# Patient Record
Sex: Male | Born: 1985 | Race: Black or African American | Hispanic: No | Marital: Single | Smoking: Current every day smoker
Health system: Southern US, Community
[De-identification: ages and names within clinical notes are randomized; demographics above are authoritative.]

---
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
## 2004-12-06 ENCOUNTER — Ambulatory Visit: Payer: Self-pay | Admitting: Family Medicine

## 2005-03-24 ENCOUNTER — Emergency Department (HOSPITAL_COMMUNITY): Admission: EM | Admit: 2005-03-24 | Discharge: 2005-03-24 | Payer: Self-pay | Admitting: Emergency Medicine

## 2007-11-19 ENCOUNTER — Emergency Department (HOSPITAL_COMMUNITY): Admission: EM | Admit: 2007-11-19 | Discharge: 2007-11-19 | Payer: Self-pay | Admitting: Family Medicine

## 2007-12-17 ENCOUNTER — Emergency Department (HOSPITAL_COMMUNITY): Admission: EM | Admit: 2007-12-17 | Discharge: 2007-12-17 | Payer: Self-pay | Admitting: Family Medicine

## 2010-01-17 ENCOUNTER — Emergency Department (HOSPITAL_COMMUNITY)
Admission: EM | Admit: 2010-01-17 | Discharge: 2010-01-17 | Payer: Self-pay | Source: Home / Self Care | Admitting: Family Medicine

## 2010-05-12 ENCOUNTER — Emergency Department (HOSPITAL_COMMUNITY)
Admission: EM | Admit: 2010-05-12 | Discharge: 2010-05-13 | Disposition: A | Discharge status: Home / Self Care | Payer: No Typology Code available for payment source | Attending: Emergency Medicine | Admitting: Emergency Medicine

## 2010-05-12 ENCOUNTER — Emergency Department (HOSPITAL_COMMUNITY): Payer: No Typology Code available for payment source

## 2010-05-12 DIAGNOSIS — Y92410 Unspecified street and highway as the place of occurrence of the external cause: Secondary | ICD-10-CM | POA: Insufficient documentation

## 2010-05-12 DIAGNOSIS — S0990XA Unspecified injury of head, initial encounter: Secondary | ICD-10-CM | POA: Insufficient documentation

## 2010-05-12 DIAGNOSIS — S139XXA Sprain of joints and ligaments of unspecified parts of neck, initial encounter: Secondary | ICD-10-CM | POA: Insufficient documentation

## 2010-07-30 ENCOUNTER — Emergency Department (HOSPITAL_COMMUNITY)
Admission: EM | Admit: 2010-07-30 | Discharge: 2010-07-30 | Disposition: A | Discharge status: Home / Self Care | Payer: Self-pay | Attending: Emergency Medicine | Admitting: Emergency Medicine

## 2010-07-30 DIAGNOSIS — Z113 Encounter for screening for infections with a predominantly sexual mode of transmission: Secondary | ICD-10-CM | POA: Insufficient documentation

## 2010-07-31 LAB — GC/CHLAMYDIA PROBE AMP, URINE
Chlamydia, Swab/Urine, PCR: NEGATIVE
GC Probe Amp, Urine: NEGATIVE

## 2010-12-11 ENCOUNTER — Emergency Department (INDEPENDENT_AMBULATORY_CARE_PROVIDER_SITE_OTHER)
Admission: EM | Admit: 2010-12-11 | Discharge: 2010-12-11 | Disposition: A | Discharge status: Home / Self Care | Payer: Self-pay | Source: Home / Self Care | Attending: Family Medicine | Admitting: Family Medicine

## 2010-12-11 DIAGNOSIS — M7550 Bursitis of unspecified shoulder: Secondary | ICD-10-CM

## 2010-12-11 DIAGNOSIS — M75100 Unspecified rotator cuff tear or rupture of unspecified shoulder, not specified as traumatic: Secondary | ICD-10-CM

## 2010-12-11 MED ORDER — IBUPROFEN 800 MG PO TABS
800.0000 mg | ORAL_TABLET | Freq: Once | ORAL | Status: AC
  Administered 2010-12-11: 800 mg via ORAL

## 2010-12-11 MED ORDER — NAPROXEN 500 MG PO TABS: 500.0000 mg | ORAL_TABLET | Freq: Two times a day (BID) | ORAL | Status: AC

## 2010-12-11 MED ORDER — IBUPROFEN 800 MG PO TABS
ORAL_TABLET | ORAL | Status: AC
  Filled 2010-12-11: qty 1

## 2010-12-11 NOTE — ED Provider Notes (Signed)
History     CSN: [id number] Arrival date & time: 12/11/2010 11:33 AM   First MD Initiated Contact with Patient 12/11/10 1048      Chief Complaint  Patient presents with  . Shoulder Pain    Pt was raising lt arm yesterday to put on jacket and had severe pain, unable to raise it now, no prior injuries    (Consider location/radiation/quality/duration/timing/severity/associated sxs/prior treatment) HPI Comments: Pt was reaching arm upward to pull sleeve down when felt pain in L shoulder; denies trauma  Patient is a 25 y.o. male presenting with shoulder pain. The history is provided by the patient.  Shoulder Pain The current episode started yesterday. The problem occurs constantly. The problem has not changed since onset.Exacerbated by: activity, palpation. The symptoms are relieved by nothing. He has tried nothing for the symptoms.    History reviewed. No pertinent past medical history.  History reviewed. No pertinent past surgical history.  History reviewed. No pertinent family history.  History  Substance Use Topics  . Smoking status: Current Everyday Smoker -- 1.0 packs/day    Types: Cigarettes  . Smokeless tobacco: Not on file  . Alcohol Use: No      Review of Systems  Constitutional: Negative for fever and chills.  Musculoskeletal: Negative for joint swelling.       Shoulder pain  Neurological: Negative for weakness and numbness.    Allergies  Review of patient's allergies indicates no known allergies.  Home Medications   Current Outpatient Rx  Name Route Sig Dispense Refill  . NAPROXEN 500 MG PO TABS Oral Take 1 tablet (500 mg total) by mouth 2 (two) times daily with a meal. 14 tablet 0    BP 120/74  Pulse 63  Temp(Src) 98 F (36.7 C) (Oral)  Resp 16  SpO2 100%  Physical Exam  Constitutional: He appears well-developed and well-nourished. No distress.  HENT:  Head: Normocephalic and atraumatic.  Pulmonary/Chest: Effort normal.  Musculoskeletal:      Left shoulder: He exhibits tenderness. He exhibits normal range of motion, no swelling, no effusion, no crepitus, no deformity, normal pulse and normal strength.       Left elbow: He exhibits normal range of motion. no tenderness found.  Neurological: He has normal strength. No sensory deficit.       Sensory and motor nl in LUE    ED Course  Procedures (including critical care time)  Labs Reviewed - No data to display No results found.   1. Subacromial bursitis       MDM          Cathlyn Parsons, NP 12/11/10 1255

## 2010-12-11 NOTE — ED Provider Notes (Signed)
Medical screening examination/treatment/procedure(s) were performed by non-physician practitioner and as supervising physician I was immediately available for consultation/collaboration.   Jelena Malicoat DOUGLAS MD.    Ojas Coone Douglas Cyann Venti, MD 12/11/10 2004 

## 2012-05-15 ENCOUNTER — Encounter (HOSPITAL_COMMUNITY): Payer: Self-pay | Admitting: Emergency Medicine

## 2012-05-15 ENCOUNTER — Emergency Department (HOSPITAL_COMMUNITY): Payer: Self-pay

## 2012-05-15 ENCOUNTER — Emergency Department (HOSPITAL_COMMUNITY)
Admission: EM | Admit: 2012-05-15 | Discharge: 2012-05-15 | Disposition: A | Discharge status: Home / Self Care | Payer: Self-pay | Attending: Emergency Medicine | Admitting: Emergency Medicine

## 2012-05-15 DIAGNOSIS — Y9289 Other specified places as the place of occurrence of the external cause: Secondary | ICD-10-CM | POA: Insufficient documentation

## 2012-05-15 DIAGNOSIS — Y9389 Activity, other specified: Secondary | ICD-10-CM | POA: Insufficient documentation

## 2012-05-15 DIAGNOSIS — S82839A Other fracture of upper and lower end of unspecified fibula, initial encounter for closed fracture: Secondary | ICD-10-CM | POA: Insufficient documentation

## 2012-05-15 DIAGNOSIS — F17200 Nicotine dependence, unspecified, uncomplicated: Secondary | ICD-10-CM | POA: Insufficient documentation

## 2012-05-15 DIAGNOSIS — S82201A Unspecified fracture of shaft of right tibia, initial encounter for closed fracture: Secondary | ICD-10-CM

## 2012-05-15 DIAGNOSIS — X500XXA Overexertion from strenuous movement or load, initial encounter: Secondary | ICD-10-CM | POA: Insufficient documentation

## 2012-05-15 MED ORDER — NAPROXEN 500 MG PO TABS: 500.0000 mg | ORAL_TABLET | Freq: Two times a day (BID) | ORAL | Status: DC

## 2012-05-15 MED ORDER — HYDROCODONE-ACETAMINOPHEN 5-325 MG PO TABS: 1.0000 | ORAL_TABLET | ORAL | Status: DC | PRN
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
## 2012-05-18 NOTE — ED Provider Notes (Signed)
Medical screening examination/treatment/procedure(s) were performed by non-physician practitioner and as supervising physician I was immediately available for consultation/collaboration  Alexismarie Flaim R. Nohlan Burdin, MD 05/18/12 1632 

## 2014-03-11 IMAGING — CR DG TIBIA/FIBULA 2V*R*
4 series · 4 of 4 positions shown · non-contrast
Comparison: None.

CLINICAL DATA: Fall, leg pain

RIGHT TIBIA AND FIBULA - 2 VIEW

[x tib-fib ap right (1 of 2)]
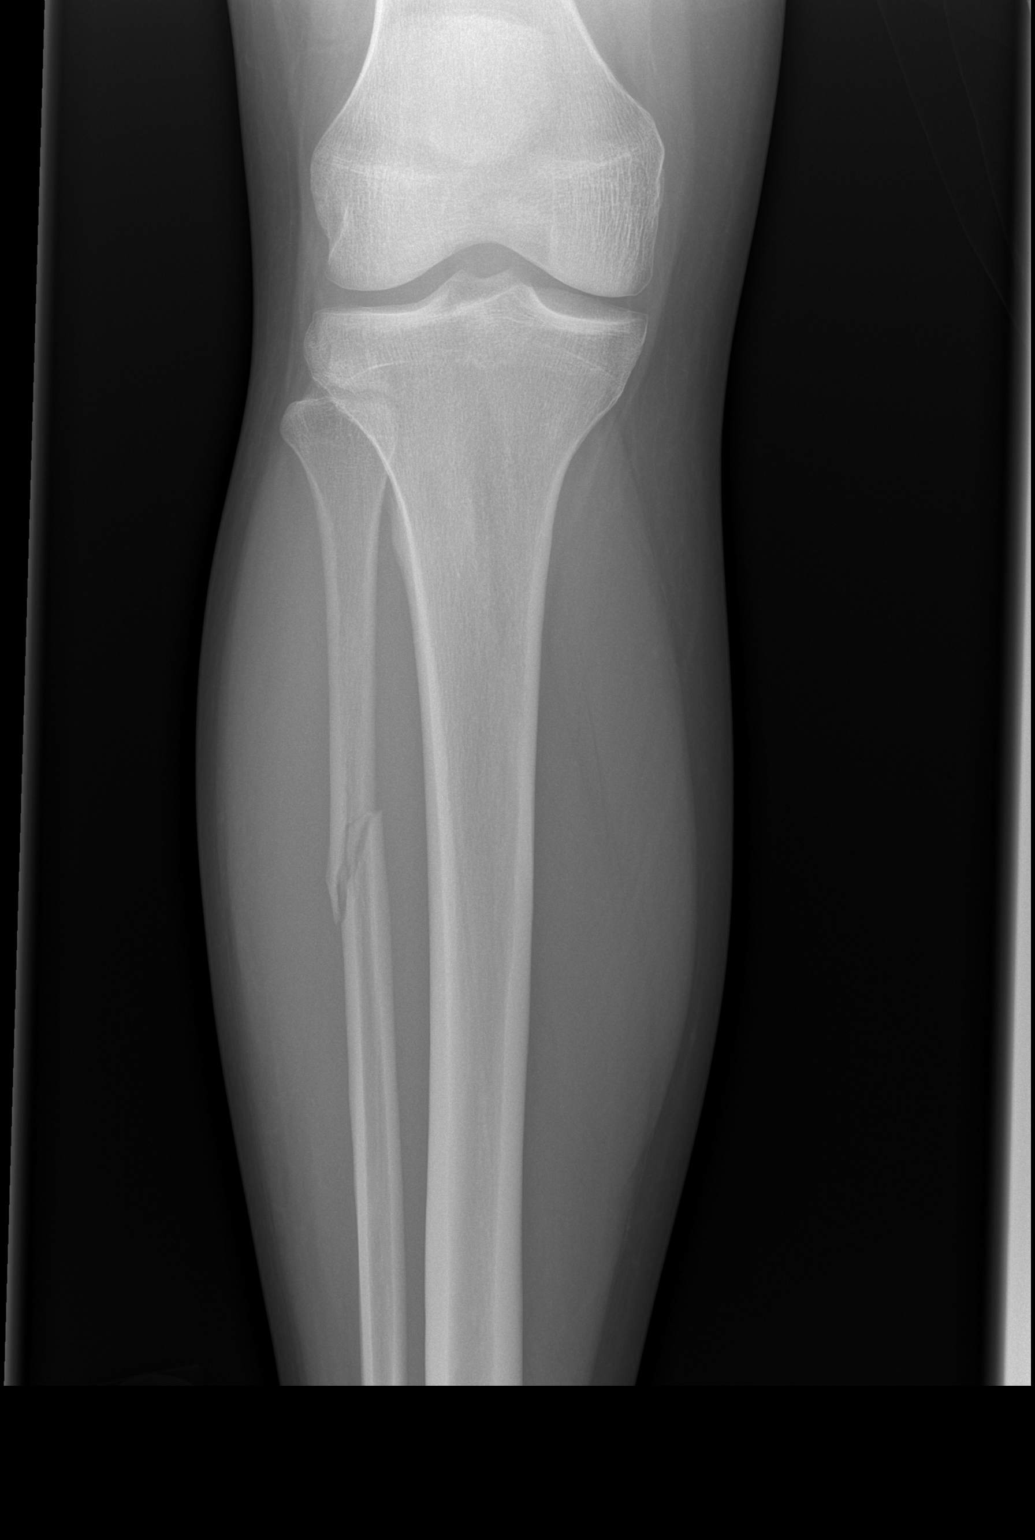

[x tib-fib ap right (2 of 2)]
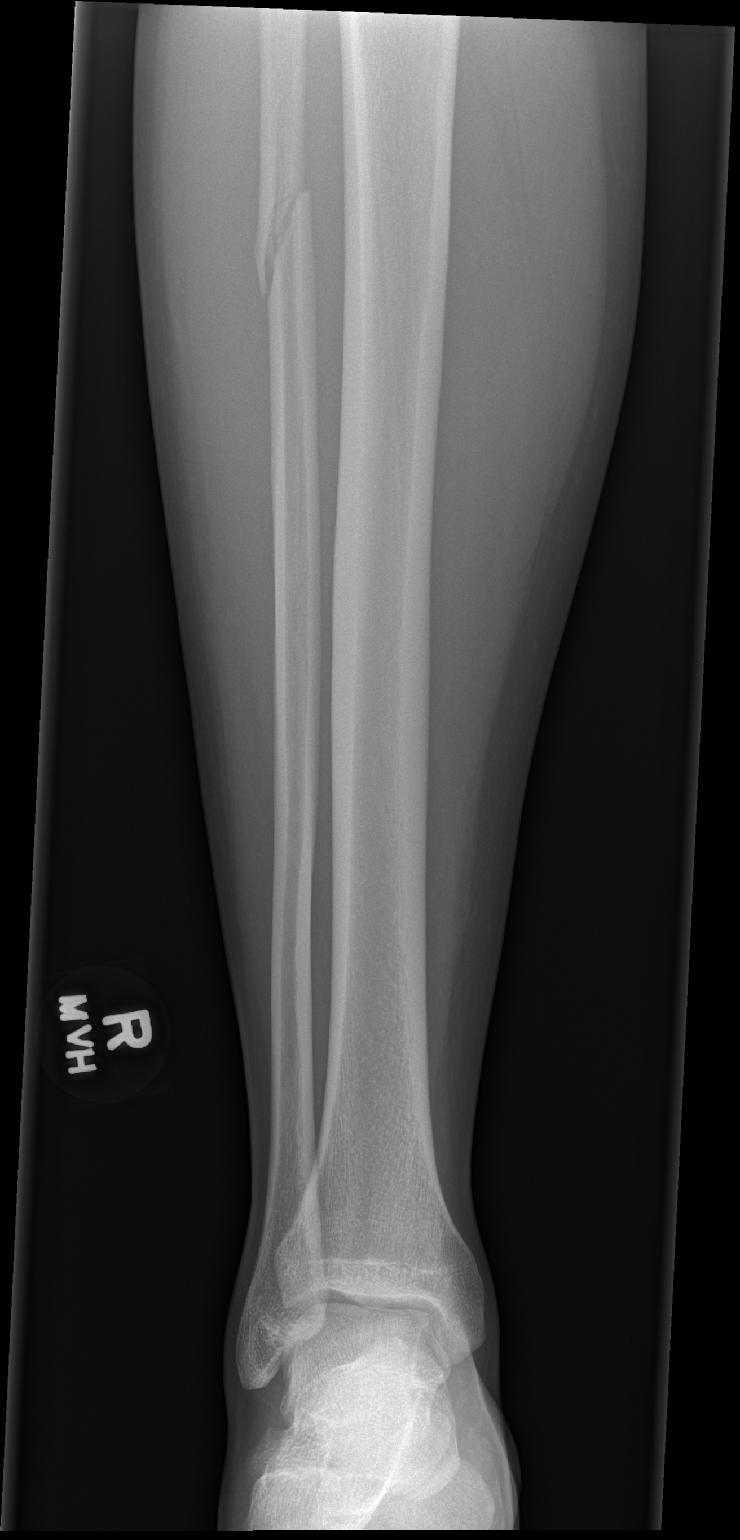

[x tib-fib lat right (1 of 2)]
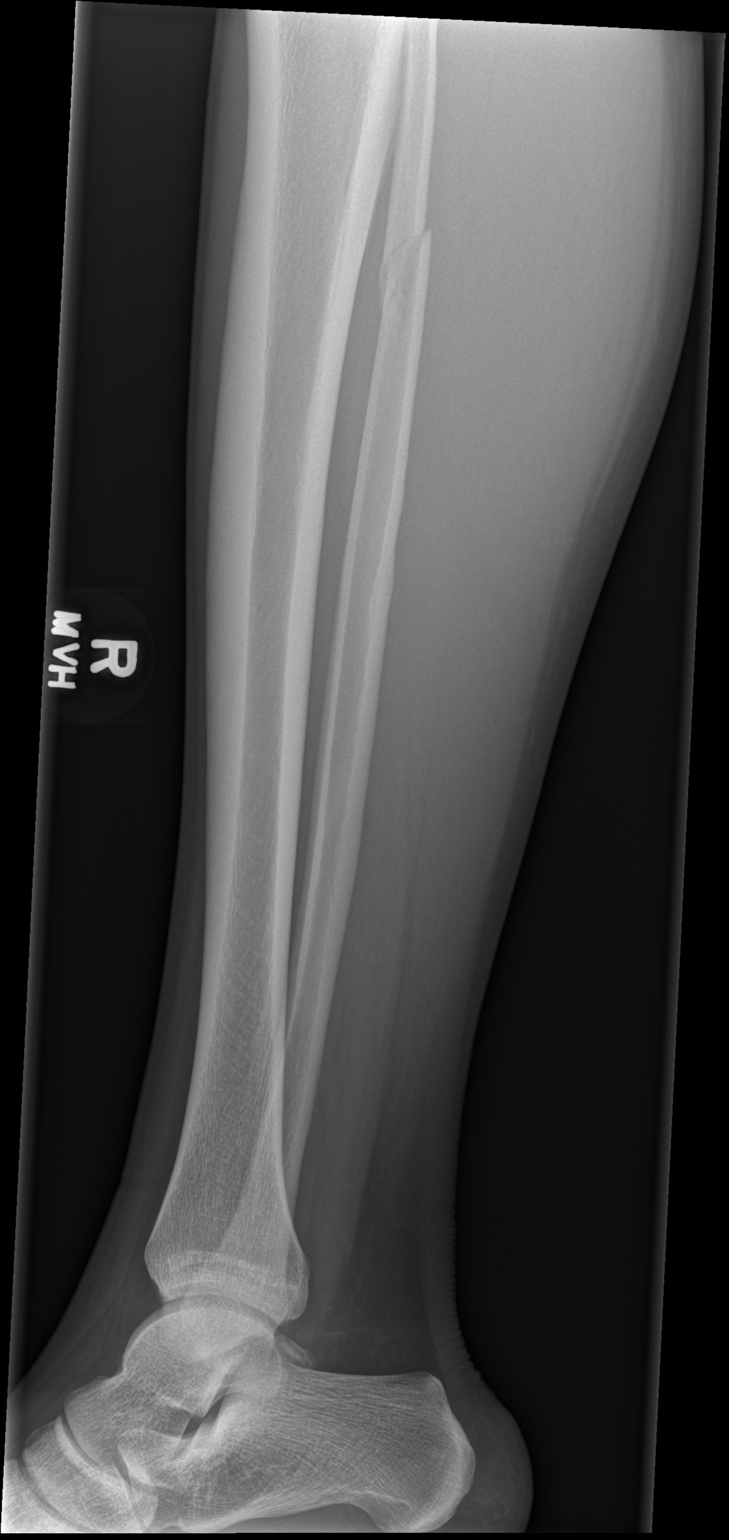

[x tib-fib lat right (2 of 2)]
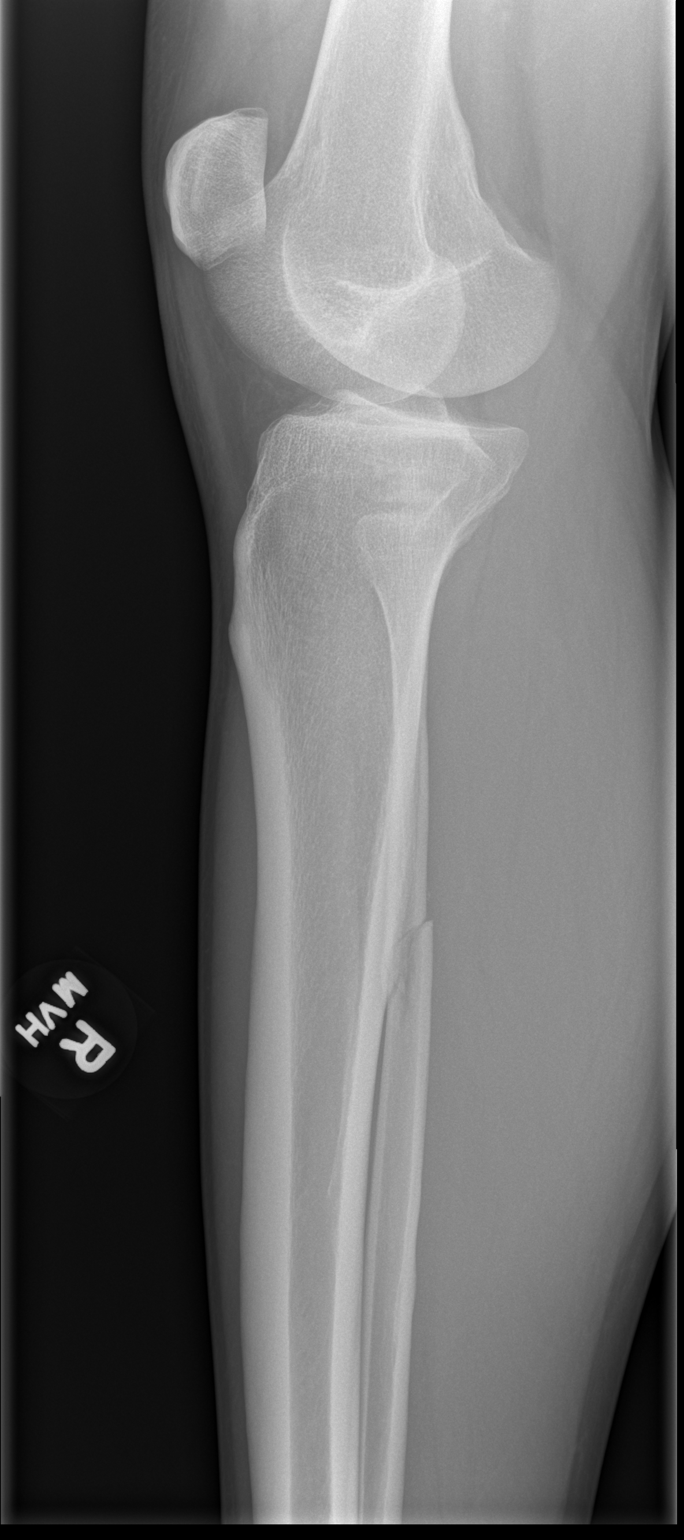

[4 of 4 positions shown; findings below may reference images not displayed]

FINDINGS: Oblique proximal right mid shaft fibular diaphyseal
fracture identified.  The tibia is unremarkable.  There is less
than one half shaft width overlap of the fracture fragments.  No
soft tissue abnormality or radiopaque foreign body.
IMPRESSION: Oblique proximal right fibular diaphyseal fracture.

## 2015-11-08 ENCOUNTER — Encounter (HOSPITAL_COMMUNITY): Payer: Self-pay | Admitting: Emergency Medicine

## 2015-11-08 ENCOUNTER — Emergency Department (HOSPITAL_COMMUNITY)
Admission: EM | Admit: 2015-11-08 | Discharge: 2015-11-08 | Disposition: A | Discharge status: Home / Self Care | Payer: Self-pay | Attending: Emergency Medicine | Admitting: Emergency Medicine

## 2015-11-08 DIAGNOSIS — M62830 Muscle spasm of back: Secondary | ICD-10-CM | POA: Insufficient documentation

## 2015-11-08 DIAGNOSIS — M546 Pain in thoracic spine: Secondary | ICD-10-CM | POA: Insufficient documentation

## 2015-11-08 DIAGNOSIS — F17210 Nicotine dependence, cigarettes, uncomplicated: Secondary | ICD-10-CM | POA: Insufficient documentation

## 2015-11-08 MED ORDER — NAPROXEN 500 MG PO TABS: 500.0000 mg | ORAL_TABLET | Freq: Two times a day (BID) | ORAL | 1 refills | Status: DC

## 2015-11-08 MED ORDER — CYCLOBENZAPRINE HCL 10 MG PO TABS: 10.0000 mg | ORAL_TABLET | Freq: Two times a day (BID) | ORAL | 0 refills | Status: DC | PRN

## 2015-11-08 MED ORDER — HYDROCODONE-ACETAMINOPHEN 5-325 MG PO TABS: 1.0000 | ORAL_TABLET | ORAL | 0 refills | Status: DC | PRN

## 2015-11-08 NOTE — ED Provider Notes (Signed)
[facility] Provider Note   CSN: [id number] Arrival date & time: 11/08/15  0740     History   Chief Complaint Chief Complaint  Patient presents with  . Back Pain    HPI Richard Hardin is a 30 y.o. male.  HPI Patient complaining of back pain x 4 days. Patient states he woke up and his back was in pain. He states that he can not bend over and tie his shoes. Patient has been taken tylenol extra strength and muscle rub for the last three days and is not helping it History reviewed. No pertinent past medical history.  There are no active problems to display for this patient.   History reviewed. No pertinent surgical history.     Home Medications    Prior to Admission medications   Medication Sig Start Date End Date Taking? Authorizing Provider  cyclobenzaprine (FLEXERIL) 10 MG tablet Take 1 tablet (10 mg total) by mouth 2 (two) times daily as needed for muscle spasms. 11/08/15   Nelva Nay, MD  HYDROcodone-acetaminophen (NORCO) 5-325 MG tablet Take 1-2 tablets by mouth every 4 (four) hours as needed. 11/08/15   Nelva Nay, MD  naproxen (NAPROSYN) 500 MG tablet Take 1 tablet (500 mg total) by mouth 2 (two) times daily with a meal. 11/08/15   Nelva Nay, MD    Family History History reviewed. No pertinent family history.  Social History Social History  Substance Use Topics  . Smoking status: Current Every Day Smoker    Packs/day: 1.00    Types: Cigarettes  . Smokeless tobacco: Never Used  . Alcohol use No     Allergies   Review of patient's allergies indicates no known allergies.   Review of Systems Review of Systems  All other systems reviewed and are negative.    Physical Exam Updated Vital Signs Ht 5\' 6"  (1.676 m)   Wt 165 lb (74.8 kg)   BMI 26.63 kg/m   Physical Exam  Constitutional: He is oriented to person, place, and time. He appears well-developed and well-nourished. No distress.  HENT:  Head: Normocephalic and atraumatic.    Eyes: Pupils are equal, round, and reactive to light.  Neck: Normal range of motion.  Cardiovascular: Normal rate and intact distal pulses.   Pulmonary/Chest: Effort normal and breath sounds normal. No respiratory distress.  Abdominal: Normal appearance. He exhibits no distension.  Musculoskeletal: Normal range of motion.       Back:  Pain is reproducible to palpation and movement.  Neurological: He is alert and oriented to person, place, and time. No cranial nerve deficit.  Skin: Skin is warm and dry. No rash noted.  Psychiatric: He has a normal mood and affect. His behavior is normal.  Nursing note and vitals reviewed.    ED Treatments / Results  Labs (all labs ordered are listed, but only abnormal results are displayed) Labs Reviewed - No data to display  EKG  EKG Interpretation None       Radiology No results found.  Procedures Procedures (including critical care time)  Medications Ordered in ED Medications - No data to display   Initial Impression / Assessment and Plan / ED Course  I have reviewed the triage vital signs and the nursing notes.  Pertinent labs & imaging results that were available during my care of the patient were reviewed by me and considered in my medical decision making (see chart for details).  Clinical Course      Final Clinical Impressions(s) / ED  Diagnoses   Final diagnoses:  Muscle spasm of back  Acute right-sided thoracic back pain    New Prescriptions New Prescriptions   CYCLOBENZAPRINE (FLEXERIL) 10 MG TABLET    Take 1 tablet (10 mg total) by mouth 2 (two) times daily as needed for muscle spasms.     Nelva Nayobert Pharell Rolfson, MD 11/08/15 [phone]

## 2015-11-08 NOTE — ED Triage Notes (Signed)
Patient complaining of back pain x 4 days. Patient states he woke up and his back was in pain. He states that he can not bend over and tie his shoes. Patient has been taken tylenol extra strength and muscle rub for the last three days and is not helping it.

## 2022-12-16 ENCOUNTER — Ambulatory Visit (HOSPITAL_COMMUNITY)
Admission: EM | Admit: 2022-12-16 | Discharge: 2022-12-16 | Disposition: A | Discharge status: Home / Self Care | Payer: Self-pay | Attending: Internal Medicine | Admitting: Internal Medicine

## 2022-12-16 ENCOUNTER — Encounter (HOSPITAL_COMMUNITY): Payer: Self-pay

## 2022-12-16 DIAGNOSIS — M79605 Pain in left leg: Secondary | ICD-10-CM

## 2022-12-16 MED ORDER — KETOROLAC TROMETHAMINE 30 MG/ML IJ SOLN
INTRAMUSCULAR | Status: AC
  Filled 2022-12-16: qty 1

## 2022-12-16 MED ORDER — KETOROLAC TROMETHAMINE 30 MG/ML IJ SOLN
30.0000 mg | Freq: Once | INTRAMUSCULAR | Status: AC
  Administered 2022-12-16: 30 mg via INTRAMUSCULAR

## 2022-12-16 MED ORDER — CYCLOBENZAPRINE HCL 5 MG PO TABS: 5.0000 mg | ORAL_TABLET | Freq: Two times a day (BID) | ORAL | 0 refills | Status: AC | PRN

## 2022-12-16 NOTE — ED Provider Notes (Signed)
[facility]    CSN: [id number] Arrival date & time: 12/16/22  1225      History   Chief Complaint Chief Complaint  Patient presents with   Leg Pain    Left thigh pain x3 days    HPI Richard Hardin is a 37 y.o. male.   Patient presents with left posterior thigh pain that radiates down his leg that has been present for about 3 days.  He denies any injury or any history of chronic pain in that area.  Denies numbness or tingling or any associated back pain.  He reports that he took ibuprofen, Tylenol, and possibly meloxicam from a family member yesterday with very minimal improvement in symptoms.   Leg Pain   No past medical history on file.  There are no problems to display for this patient.   History reviewed. No pertinent surgical history.     Home Medications    Prior to Admission medications   Medication Sig Start Date End Date Taking? Authorizing Provider  cyclobenzaprine (FLEXERIL) 5 MG tablet Take 1 tablet (5 mg total) by mouth 2 (two) times daily as needed for muscle spasms. 12/16/22  Yes Gustavus Bryant, FNP    Family History History reviewed. No pertinent family history.  Social History Social History   Tobacco Use   Smoking status: Every Day    Current packs/day: 1.00    Types: Cigarettes   Smokeless tobacco: Never  Vaping Use   Vaping status: Never Used  Substance Use Topics   Alcohol use: Yes   Drug use: Yes    Types: Marijuana     Allergies   Patient has no known allergies.   Review of Systems Review of Systems Per HPI  Physical Exam Triage Vital Signs ED Triage Vitals  Encounter Vitals Group     BP 12/16/22 1345 (!) 156/99     Systolic BP Percentile --      Diastolic BP Percentile --      Pulse Rate 12/16/22 1345 78     Resp 12/16/22 1345 17     Temp 12/16/22 1345 98.7 F (37.1 C)     Temp Source 12/16/22 1345 Oral     SpO2 12/16/22 1345 95 %     Weight 12/16/22 1344 217 lb (98.4 kg)     Height 12/16/22 1344  5\' 6"  (1.676 m)     Head Circumference --      Peak Flow --      Pain Score 12/16/22 1344 10     Pain Loc --      Pain Education --      Exclude from Growth Chart --    No data found.  Updated Vital Signs BP (!) 156/99 (BP Location: Left Arm)   Pulse 78   Temp 98.7 F (37.1 C) (Oral)   Resp 17   Ht 5\' 6"  (1.676 m)   Wt 217 lb (98.4 kg)   SpO2 95%   BMI 35.02 kg/m   Visual Acuity Right Eye Distance:   Left Eye Distance:   Bilateral Distance:    Right Eye Near:   Left Eye Near:    Bilateral Near:     Physical Exam Constitutional:      General: He is not in acute distress.    Appearance: Normal appearance. He is not toxic-appearing or diaphoretic.  HENT:     Head: Normocephalic and atraumatic.  Eyes:     Extraocular Movements: Extraocular movements intact.  Conjunctiva/sclera: Conjunctivae normal.  Pulmonary:     Effort: Pulmonary effort is normal.  Musculoskeletal:       Legs:     Comments: No tenderness to lumbar region.  No tenderness to palpation to left leg where pain is occurring.  Patient reports pain mainly occurs with movement and bearing weight.  No swelling or discoloration noted.  No abrasions or lacerations.  No visible infection or abscess.  Appears to be neurovascularly intact.  Neurological:     General: No focal deficit present.     Mental Status: He is alert and oriented to person, place, and time. Mental status is at baseline.  Psychiatric:        Mood and Affect: Mood normal.        Behavior: Behavior normal.        Thought Content: Thought content normal.        Judgment: Judgment normal.      UC Treatments / Results  Labs (all labs ordered are listed, but only abnormal results are displayed) Labs Reviewed - No data to display  EKG   Radiology No results found.  Procedures Procedures (including critical care time)  Medications Ordered in UC Medications  ketorolac (TORADOL) 30 MG/ML injection 30 mg (30 mg Intramuscular  Given 12/16/22 1409)    Initial Impression / Assessment and Plan / UC Course  I have reviewed the triage vital signs and the nursing notes.  Pertinent labs & imaging results that were available during my care of the patient were reviewed by me and considered in my medical decision making (see chart for details).     Differential diagnosis include hamstring muscle strain versus sciatica.  Will treat with IM Toradol today and muscle relaxer.  Patient advised no additional NSAIDs for at least 24 hours following injection.  Also educated patient that muscle relaxer can make him drowsy and do not drive or drink alcohol with taking it.  He denies that he takes any daily medications so that should be safe.  Blood pressure slightly elevated but suspect pain is contributing.  Advised patient to follow-up if any symptoms persist or worsen.  Imaging deferred given no injury or direct bony tenderness.  Patient verbalized understanding and was agreeable with plan. Final Clinical Impressions(s) / UC Diagnoses   Final diagnoses:  Left leg pain     Discharge Instructions      You were given a Toradol shot today in urgent care for pain.  Do not take any ibuprofen, Advil, Aleve, meloxicam for at least 24 hours following injection.  I have also prescribed you a muscle relaxer to take as needed.  Please be advised that it can make you drowsy so do not drive or drink alcohol taking it.    ED Prescriptions     Medication Sig Dispense Auth. Provider   cyclobenzaprine (FLEXERIL) 5 MG tablet Take 1 tablet (5 mg total) by mouth 2 (two) times daily as needed for muscle spasms. 20 tablet [place], [place], [place]      I have reviewed the PDMP during this encounter.   Gustavus Bryant, [place] 12/16/22 1415

## 2022-12-16 NOTE — ED Triage Notes (Signed)
Pt states that he has some left thigh pain.x3 days.  Pt denies any known injury.

## 2022-12-16 NOTE — Discharge Instructions (Signed)
You were given a Toradol shot today in urgent care for pain.  Do not take any ibuprofen, Advil, Aleve, meloxicam for at least 24 hours following injection.  I have also prescribed you a muscle relaxer to take as needed.  Please be advised that it can make you drowsy so do not drive or drink alcohol taking it.
